# Patient Record
Sex: Female | Born: 1970 | Race: Black or African American | Hispanic: No | Marital: Single | Smoking: Never smoker
Health system: Southern US, Community
[De-identification: ages and names within clinical notes are randomized; demographics above are authoritative.]

## PROBLEM LIST (undated history)

## (undated) DIAGNOSIS — I10 Essential (primary) hypertension: Secondary | ICD-10-CM

## (undated) DIAGNOSIS — E079 Disorder of thyroid, unspecified: Secondary | ICD-10-CM

## (undated) HISTORY — DX: Essential (primary) hypertension: I10

## (undated) HISTORY — DX: Disorder of thyroid, unspecified: E07.9

---

## 1998-04-22 ENCOUNTER — Other Ambulatory Visit: Admission: RE | Admit: 1998-04-22 | Discharge: 1998-04-22 | Payer: Self-pay | Admitting: Obstetrics and Gynecology

## 1999-09-05 ENCOUNTER — Other Ambulatory Visit: Admission: RE | Admit: 1999-09-05 | Discharge: 1999-09-05 | Payer: Self-pay | Admitting: *Deleted

## 2000-09-21 ENCOUNTER — Other Ambulatory Visit: Admission: RE | Admit: 2000-09-21 | Discharge: 2000-09-21 | Payer: Self-pay | Admitting: *Deleted

## 2001-11-03 ENCOUNTER — Other Ambulatory Visit: Admission: RE | Admit: 2001-11-03 | Discharge: 2001-11-03 | Payer: Self-pay | Admitting: *Deleted

## 2002-12-05 ENCOUNTER — Other Ambulatory Visit: Admission: RE | Admit: 2002-12-05 | Discharge: 2002-12-05 | Payer: Self-pay | Admitting: *Deleted

## 2003-05-04 ENCOUNTER — Encounter (INDEPENDENT_AMBULATORY_CARE_PROVIDER_SITE_OTHER): Payer: Self-pay | Admitting: Specialist

## 2003-05-04 ENCOUNTER — Ambulatory Visit (HOSPITAL_COMMUNITY): Admission: RE | Admit: 2003-05-04 | Discharge: 2003-05-04 | Payer: Self-pay | Admitting: *Deleted

## 2003-12-04 ENCOUNTER — Other Ambulatory Visit: Admission: RE | Admit: 2003-12-04 | Discharge: 2003-12-04 | Payer: Self-pay | Admitting: *Deleted

## 2005-10-19 ENCOUNTER — Encounter (INDEPENDENT_AMBULATORY_CARE_PROVIDER_SITE_OTHER): Payer: Self-pay | Admitting: Specialist

## 2005-10-19 ENCOUNTER — Ambulatory Visit (HOSPITAL_COMMUNITY): Admission: RE | Admit: 2005-10-19 | Discharge: 2005-10-19 | Payer: Self-pay | Admitting: *Deleted

## 2005-10-19 ENCOUNTER — Ambulatory Visit (HOSPITAL_BASED_OUTPATIENT_CLINIC_OR_DEPARTMENT_OTHER): Admission: RE | Admit: 2005-10-19 | Discharge: 2005-10-19 | Payer: Self-pay | Admitting: *Deleted

## 2008-02-24 ENCOUNTER — Ambulatory Visit (HOSPITAL_COMMUNITY): Admission: RE | Admit: 2008-02-24 | Discharge: 2008-02-25 | Payer: Self-pay | Admitting: *Deleted

## 2008-02-24 ENCOUNTER — Encounter (INDEPENDENT_AMBULATORY_CARE_PROVIDER_SITE_OTHER): Payer: Self-pay | Admitting: *Deleted

## 2008-03-05 ENCOUNTER — Observation Stay (HOSPITAL_COMMUNITY): Admission: AD | Admit: 2008-03-05 | Discharge: 2008-03-06 | Payer: Self-pay | Admitting: Obstetrics & Gynecology

## 2009-08-20 ENCOUNTER — Emergency Department (HOSPITAL_COMMUNITY): Admission: EM | Admit: 2009-08-20 | Discharge: 2009-08-20 | Payer: Self-pay | Admitting: Family Medicine

## 2010-11-12 ENCOUNTER — Emergency Department (HOSPITAL_COMMUNITY): Admission: EM | Admit: 2010-11-12 | Discharge: 2010-11-12 | Payer: Self-pay | Admitting: Family Medicine

## 2011-03-03 LAB — POCT URINALYSIS DIPSTICK
Bilirubin Urine: NEGATIVE
Glucose, UA: NEGATIVE mg/dL
Hgb urine dipstick: NEGATIVE
Ketones, ur: NEGATIVE mg/dL
pH: 5.5 (ref 5.0–8.0)

## 2011-03-28 LAB — POCT URINALYSIS DIP (DEVICE)
Glucose, UA: NEGATIVE mg/dL
Nitrite: NEGATIVE
Specific Gravity, Urine: >=1.03 (ref 1.005–1.030)
Urobilinogen, UA: 1 mg/dL (ref 0.0–1.0)

## 2011-03-28 LAB — POCT PREGNANCY, URINE: Preg Test, Ur: NEGATIVE

## 2011-05-05 NOTE — Op Note (Signed)
NAME:  Rachel Jensen, Rachel Jensen            ACCOUNT NO.:  [id number]   MEDICAL RECORD NO.:  [id number]          PATIENT TYPE:  OIB   LOCATION:  0098                         FACILITY:  [facility]   PHYSICIAN:  Gerri Spore B. Earlene Plater, M.D.  DATE OF BIRTH:  06-Jul-1971   DATE OF PROCEDURE:  02/24/2008  DATE OF DISCHARGE:                               OPERATIVE REPORT   PREOPERATIVE DIAGNOSES:  Abnormal uterine bleeding and uterine fibroids.   POSTOPERATIVE DIAGNOSES:  Abnormal uterine bleeding and uterine  fibroids.   PROCEDURE:  Da Vinci total laparoscopic hysterectomy with uterine  morcellation.   SURGEON:  Dr. Marina Gravel.   ASSISTANT:  Dr. Genia Del.   ANESTHESIA:  General.   FINDINGS:  A 976 gram fibroid uterus, normal-appearing tubes and ovaries  otherwise normal-appearing pelvis and upper abdomen.   BLOOD LOSS:  150 mL.   COMPLICATIONS:  None.   SPECIMENS:  Uterus and cervix to pathology.   INDICATIONS:  Patient with a history of heavy menstrual bleeding,  associated bulk related symptoms from fibroid uterus, requesting  definitive surgical management.  Patient advised of the risks of surgery  including infection, bleeding, damage to surrounding organs and  potential need to convert to abdominal hysterectomy given the bulk of  her uterus.   PROCEDURE:  The patient was taken to the operating room and general  anesthesia obtained.  She was prepped and draped in standard fashion and  a Foley catheter inserted into the bladder.  The uterus was sounded to  10 cm.  The #10 Rumi tip was assembled, inserted and secured in standard  fashion.   A 10-mm incision placed in the umbilicus, carried sharply to the fascia.  The fascia was divided sharply and elevated with Kocher clamps.  The  posterior sheath and peritoneum were elevated and entered sharply.  A  pursestring suture of #0 Vicryl placed around the fascial defect.  Hassan cannula inserted and secured.  Pneumoperitoneum obtained  with CO2  gas.  Trendelenburg position obtained. The pelvis inspected.  Ancillary  ports placed, two to the left of midline, two to the right, each under  direct laparoscopic visualization.  The three robotic ports were 8 mm  and the assistant port was 10 mm.   The robot was brought in and docked in a standard manner.  The uterus  was elevated with the Rumi and the course of each ureter identified,  found to be well away.  The uterus was approximately 7-8 cm above the  pelvic brim but appeared quite mobile and with good visualization of the  anterior and posterior cul-de-sacs as well as the course of each ureter.  Therefore it seemed reasonable to proceed.   The left round ligament was sealed and divided with gyrus PK and  monopolar scissors.  The right tube and uterine ovarian pedicle were  similarly sealed and divided.  The bladder flap was created sharply, the  left uterine artery skeletonized, sealed and divided with a gyrus PK and  monopolar scissors at the level of the KOHring.  The entire procedure  was repeated on the right side in the exact same  manner.  We had  switched to the 30 degree down scope during this portion of the  dissection to facilitate visualization around the bulky uterus.   Anterior colpotomy made with monopolar scissors around to the angles,  posterior colpotomy made in a similar manner.  The angles were taken  down with gyrus PK.  The uterus was taken off of the Rumi and placed in  the gutter.  The Rumi was removed along with the KOH-ring. The pneumo-  occluder balloon was inserted into the vagina to maintain  pneumoperitoneum. The vaginal cuff was then closed with a running stitch  of zero  PDS. It was taken from the right angle to the midline and then  from the left angle to midline and tied in the middle.  Hemostasis was  obtained. The pelvis was irrigated, pneumoperitoneum taken down, the  vaginal cuff was hemostatic.  Therefore the robot was undocked  and taken  away. The morcellator was inserted through the assistant port and the  uterus morcellated in standard fashion.  All debris was removed.  The  pelvis was copiously irrigated and thoroughly inspected along with the  upper abdomen for any debris of the uterus which was all removed.   The ancillary ports were removed.  Their sites were inspected  laparoscopically and were found to be hemostatic. The scope was removed,  gas released, Hassan cannula removed.  The umbilical incision elevated  with [employer] retractors and the pursestring suture snugged down.  This  did not completely obliterate the fascial defect, therefore there was  about a 1 cm segment above the pursestring suture which was elevated  with Kocher clamps and carefully closed with interrupted stitches of the  same suture taking care to avoid the underlying structures.  The  subcutaneous tissue at each site was reapproximated with 4-0 Vicryl. The  skin was closed with Dermabond.   The patient tolerated the procedure well with no complications.  She was  taken to the recovery room awake, alert in stable condition.  All counts  were correct per the operating  room staff.      Gerri Spore B. Earlene Plater, M.D.  Electronically Signed     WBD/MEDQ  D:  02/24/2008  T:  02/27/2008  Job:  [id number]

## 2011-05-05 NOTE — Op Note (Signed)
NAME:  Rachel Jensen, Rachel Jensen            ACCOUNT NO.:  [id number]   MEDICAL RECORD NO.:  [id number]          PATIENT TYPE:  INP   LOCATION:  9318                          FACILITY:  [facility]   PHYSICIAN:  Genia Del, M.D.DATE OF BIRTH:  May 16, 1971   DATE OF PROCEDURE:  03/05/2008  DATE OF DISCHARGE:                               OPERATIVE REPORT   PREOPERATIVE DIAGNOSIS:  Vaginal bleeding postop total laparoscopy  hysterectomy by robotic with partial vaginal vault dehiscence.   POSTOPERATIVE DIAGNOSIS:  Vaginal bleeding postop total laparoscopy  hysterectomy by robotic with partial vaginal vault dehiscence.   PROCEDURE:  Vaginal vault repair.   SURGEON:  Dr. Genia Del, no assistant.   ANESTHESIOLOGIST:  Dr. Arby Barrette.   PROCEDURE:  Under MAC analgesia, the patient is in lithotomy position.  She is prepped with Betadine on the suprapubic, vulvar and vaginal  areas.  The bladder is catheterized.  A weighted speculum is used and an  anterior and lateral retractor.  We visualize the vaginal vault which is  partially dehiscent.  The vaginal mucosa is not completely closed but no  communication is visible from the vagina to the intraperitoneal cavity.  We have minimal old blood but no active bleeding.  A figure-of-eight is  done at the vaginal vault towards the left side with Vicryl 0 and then a  second figure-of-eight more towards the right aspect of the vaginal  vault with Vicryl 0 again.  That closes the vaginal mucosa well.  Hemostasis is adequate.  We therefore removed all instruments.  The  estimated blood loss was minimal.  No complications occurred and the  patient was brought to recovery room in good stable status.  Note that  Flagyl 500 mg IV was given at induction and the patient will be  continued on antibiotics.  Her preop white blood cell count was above 15  but the patient was afebrile and her hemoglobin went from 9 to 8.3 in  maternity admission but the patient was  quite dehydrated and IV  rehydration with Ringer's lactate was done and the second hemoglobin was  probably more diluted.      Genia Del, M.D.  Electronically Signed     ML/MEDQ  D:  03/05/2008  T:  03/06/2008  Job:  [id number]

## 2011-05-08 NOTE — Op Note (Signed)
NAME:  [name], [name]            ACCOUNT NO.:  [id number]   MEDICAL RECORD NO.:  [id number]          PATIENT TYPE:  AMB   LOCATION:  NESC                         FACILITY:  [facility]   PHYSICIAN:  Pershing Cox, M.D.DATE OF BIRTH:  19-May-1971   DATE OF PROCEDURE:  10/19/2005  DATE OF DISCHARGE:                                 OPERATIVE REPORT   Rachel Jensen is a 40 year old African-American female who has a known  problem of multiple uterine myomas. She actually had a uterine myoma  resected with a large endometrial polyp in May 2004. She presented for  annual examination in June and was complaining of a six months of increasing  heavy bleeding with clots. Sonogram was ordered and confirmed multiple  uterine myomas, and in the endometrial cavity one was noted. She  subsequently underwent a hydrosonogram which confirmed the presence of two  myomas which appeared to be intraluminal. She received Lupron one dose but  has continued to bleed lightly while on the Lupron. She is brought to the  operating room today for resection of these myomas.   OPERATIVE FINDINGS:  The endometrial canal sounded to a depth of 11 cm. I am  sure this is erroneous because one of the fibroids broadly covered the  fundus. The tubal ostia were never visualized, probably compressed and  distorted by this large fundal myoma. Both of the myomas were broadly  attached to the myometrium, and no plane could be seen to get around the  myomas. They were resected to their bases on each side.   PROCEDURE:  Rachel Jensen was brought to the operating with an IV in  place. She received a gram of Ancef in the holding area. Supine on the OR  table, IV sedation was administered followed by the placement of an LMA. She  was placed into Allen stirrups and positioned for surgery. Betadine was used  to prep her perineum and vagina. The patient was then draped for sterile  vaginal procedure. A collecting drape was placed  beneath her hips to  carefully measure the effluent during our procedure. Sorbitol was used for  the distension of her endometrial canal.   A bivalve speculum was inserted into the vagina. Cervix was anesthetized  with Marcaine paracervical block using a total of 10 cc injected in the  anterior cervix at 3, 4, 7, and 8 positions. A sound was passed into the  endometrial cavity easily. Serial Pratt dilators were used to dilate to size  31. The resectoscope attached to a double loop wire was placed through the  cervix. Using through-and-through sorbitol irrigation, the cavity was  visualized. The upper cavity could not be seen at all because of the fibroid  arising from the left lower sidewall. Therefore, I began the resection of  this fibroid first. The settings were cut 190, 110, blend 1. During the  procedure, this loop actually was damaged and could not be used any longer,  and I used a single loop, and the settings for that were 120/110 pure, no  blend. It should be noted that the single loop wire was much more  efficient  in resecting these myomas.   With complete visualization throughout the resection, the fibroids were  resected from the sides and topped to the base. Once the lower left lower  quadrant fibroid had been resected, I was then able to see into the upper  endometrium. The fibroid there was projecting into the cavity but did not  appear to be at all broadly attached on sonogram but was broadly attached on  the examination. Using the single loop wire, the fibroid was serially  resected  until we were at its base. Fortunately, I was able to complete this before  we reached a deficit of 965 cc. The uterus was carefully photographed, and  the procedure was stopped. The patient was taken to the recovery room in  excellent condition. She will receive another dose of Lupron in my office  and will be placed on iron for the interval.      Pershing Cox, M.D.   Electronically Signed     MAJ/MEDQ  D:  10/19/2005  T:  10/19/2005  Job:  [id number]

## 2011-05-08 NOTE — Op Note (Signed)
NAME:  Rachel Jensen, Rachel Jensen                      ACCOUNT NO.:  [id number]   MEDICAL RECORD NO.:  [id number]                   PATIENT TYPE:  AMB   LOCATION:  DAY                                  FACILITY:  [facility]   PHYSICIAN:  Pershing Cox, M.D.            DATE OF BIRTH:  1971-04-12   DATE OF PROCEDURE:  05/04/2003  DATE OF DISCHARGE:                                 OPERATIVE REPORT   PREOPERATIVE DIAGNOSES:  1. Dysfunction uterine bleeding on oral contraceptives.  2. Intraluminal uterine myoma.  3. Multiple uterine myomas.   POSTOPERATIVE DIAGNOSES:  1. Multiple uterine myomas.  2. Large endometrial polyp.  3. Two small submucosal myomas.   PROCEDURE:  1. Exam under anesthesia.  2. Fractional D&C.  3. Resection of large fundal uterine polyp and two small submucosal myomas.   ANESTHESIA:  General endotracheal and Marcaine paracervical block.   SURGEON:  Pershing Cox, M.D.   INDICATIONS FOR PROCEDURE:  this patient is 40 years old.  She has had one  pregnancy in the past.  She has been on oral contraceptives and has had  heavy and irregular bleeding.  Sonogram documented her myomas then suggested  a submucosal myoma.  Hydrosonogram was performed which showed a large  intraluminal filling defect, consistent with either a polyp or a myoma.  The  patient is brought to the operating room today for a resection of this  intraluminal defect.   FINDINGS:  The patient's uterus is 12 weeks in size with an irregular shape.  It is anteflexed.  The endometrial cavity was 11 cm in depth.  On  hysteroscopy, we found a very large polyp arising from the uterine fundus.  There were two small submucosal myomas in the left lower uterine segment.   DESCRIPTION OF PROCEDURE:  Rachel Jensen was brought to the operating  room with an IV in place.  In the holding area, she received 1 g of Ancef.  She was taken to the operating room and supine on the OR table, general  endotracheal  anesthesia was administered without difficulty.  She was then  placed into Allen stirrups, and exam under anesthesia was performed.  Hibiclens was used to prep the vagina, perineum, and upper thighs.  A red  rubber catheter was used to sterilely empty the bladder.  The patient was  then draped for a vaginal procedure with a collecting drape beneath her hips  so that we could measure the effluent carefully during the procedure.   Bivalve speculum was inserted into the vagina.  The cervix was visualized,  and a single-tooth tenaculum was used to grasp the anterior cervix.  Marcaine 0.25% was injected into the periphery of the cervix at the 3,4, 7,  and 8 positions, using a total volume of 12 mL of 0.25% Marcaine.  Kevorkian  curette was used to obtain endocervical curettings.  The sound then passed  to a depth of 11 cm.  Serial Pratt dilators were used to dilate the cervix  to size 33.  The resectoscope was introduced through the cervix and using  through-and-through sorbitol irrigation, the cavity was visualized, and  photographs were taken.  I was not able to see the tubal ostia, but I was  able to see the entire cavity well.   The tissue at the top of the uterus was softer than I would anticipate for a  fibroid.  This was resected using the resectoscope with a right-angle wire  set on 110/110 blend 1.  Fragments of the polyp were resected and removed  using the Randall stone forceps.  The base of the polyp was grasped with the  Randall stone forceps and retrieved by force.  Once the large polyp was out,  I was able to inspect the cavity, and there was no active bleeding from the  site of resection.  A small sharp curette was then used to serially curette  the lining of the endometrium.  The cavity was reinspected, and there were  two small uterine myomas, one arising from the left wall of the uterus and  one arising from the lower uterine segment on the left.  Again, using the   resectoscope with the right-angle wire at settings blend 1, blend 1, these  fibroids were carefully resected until the wall was smooth.  It is almost  certain that portions of the fibroids were left behind because these were  deeply imbedded in the wall.  There was no significant bleeding at the end  of the procedure.  The instruments were removed.  Pressure was placed on the  cervix because there was bleeding from the tenaculum sites.  Prior to  removing the tenaculum, the uterine sound passed again to a depth of 11 cm.   SPECIMENS:  1. Endocervical curettings.  2. Endometrial polyp and endometrial curettings.  3. Fragments of uterine myomas.   COMPLICATIONS:  None.                                               Pershing Cox, M.D.    MAJ/MEDQ  D:  05/04/2003  T:  05/04/2003  Job:  [id number]

## 2011-09-14 LAB — CBC
Hemoglobin: 11.8 — ABNORMAL LOW
Hemoglobin: 6.6 — CL
Hemoglobin: 8.3 — ABNORMAL LOW
Hemoglobin: 9.3 — ABNORMAL LOW
MCHC: 33.9
MCHC: 32.4
MCHC: 33.7
MCV: 83.1
Platelets: 534 — ABNORMAL HIGH
Platelets: 559 — ABNORMAL HIGH
RBC: 2.35 — ABNORMAL LOW
RBC: 3.01 — ABNORMAL LOW
RBC: 3.34 — ABNORMAL LOW
RDW: 24.7 — ABNORMAL HIGH
RDW: 25.6 — ABNORMAL HIGH
RDW: 22.4 — ABNORMAL HIGH
WBC: 9.7

## 2011-09-14 LAB — DIFFERENTIAL
Basophils Absolute: 0
Basophils Relative: 1
Lymphocytes Relative: 28
Neutro Abs: 2.8
Neutrophils Relative %: 61

## 2011-09-14 LAB — TYPE AND SCREEN
ABO/RH(D): B NEG
ABO/RH(D): B NEG
Antibody Screen: NEGATIVE
Antibody Screen: NEGATIVE

## 2011-09-14 LAB — ABO/RH: ABO/RH(D): B NEG

## 2011-09-14 LAB — PREGNANCY, URINE: Preg Test, Ur: NEGATIVE

## 2013-07-04 ENCOUNTER — Other Ambulatory Visit: Payer: Self-pay | Admitting: Internal Medicine

## 2013-07-04 DIAGNOSIS — Z1231 Encounter for screening mammogram for malignant neoplasm of breast: Secondary | ICD-10-CM

## 2013-07-27 ENCOUNTER — Ambulatory Visit
Admission: RE | Admit: 2013-07-27 | Discharge: 2013-07-27 | Disposition: A | Discharge status: Home / Self Care | Payer: 59 | Source: Ambulatory Visit | Attending: Internal Medicine | Admitting: Internal Medicine

## 2013-07-27 DIAGNOSIS — Z1231 Encounter for screening mammogram for malignant neoplasm of breast: Secondary | ICD-10-CM

## 2014-08-22 ENCOUNTER — Other Ambulatory Visit: Payer: Self-pay

## 2014-08-22 DIAGNOSIS — Z1231 Encounter for screening mammogram for malignant neoplasm of breast: Secondary | ICD-10-CM

## 2014-09-04 ENCOUNTER — Ambulatory Visit
Admission: RE | Admit: 2014-09-04 | Discharge: 2014-09-04 | Disposition: A | Discharge status: Home / Self Care | Payer: 59 | Source: Ambulatory Visit

## 2014-09-04 DIAGNOSIS — Z1231 Encounter for screening mammogram for malignant neoplasm of breast: Secondary | ICD-10-CM

## 2015-08-08 ENCOUNTER — Other Ambulatory Visit: Payer: Self-pay

## 2015-08-08 DIAGNOSIS — Z1231 Encounter for screening mammogram for malignant neoplasm of breast: Secondary | ICD-10-CM

## 2015-08-22 ENCOUNTER — Other Ambulatory Visit: Payer: Self-pay | Admitting: Orthopedic Surgery

## 2015-08-22 DIAGNOSIS — R52 Pain, unspecified: Secondary | ICD-10-CM

## 2015-08-22 DIAGNOSIS — M2560 Stiffness of unspecified joint, not elsewhere classified: Secondary | ICD-10-CM

## 2015-09-06 ENCOUNTER — Ambulatory Visit
Admission: RE | Admit: 2015-09-06 | Discharge: 2015-09-06 | Disposition: A | Discharge status: Home / Self Care | Payer: 59 | Source: Ambulatory Visit

## 2015-09-06 DIAGNOSIS — Z1231 Encounter for screening mammogram for malignant neoplasm of breast: Secondary | ICD-10-CM

## 2016-09-21 ENCOUNTER — Other Ambulatory Visit: Payer: Self-pay | Admitting: Family Medicine

## 2016-09-21 DIAGNOSIS — Z1231 Encounter for screening mammogram for malignant neoplasm of breast: Secondary | ICD-10-CM

## 2016-10-09 ENCOUNTER — Ambulatory Visit: Payer: 59

## 2016-10-15 ENCOUNTER — Ambulatory Visit
Admission: RE | Admit: 2016-10-15 | Discharge: 2016-10-15 | Disposition: A | Discharge status: Home / Self Care | Payer: 59 | Source: Ambulatory Visit | Attending: Family Medicine | Admitting: Family Medicine

## 2016-10-15 DIAGNOSIS — Z1231 Encounter for screening mammogram for malignant neoplasm of breast: Secondary | ICD-10-CM

## 2017-08-05 ENCOUNTER — Ambulatory Visit (INDEPENDENT_AMBULATORY_CARE_PROVIDER_SITE_OTHER): Payer: 59

## 2017-08-05 ENCOUNTER — Ambulatory Visit (INDEPENDENT_AMBULATORY_CARE_PROVIDER_SITE_OTHER): Payer: 59 | Admitting: Orthopedic Surgery

## 2017-08-05 ENCOUNTER — Encounter (INDEPENDENT_AMBULATORY_CARE_PROVIDER_SITE_OTHER): Payer: Self-pay | Admitting: Orthopedic Surgery

## 2017-08-05 DIAGNOSIS — M7541 Impingement syndrome of right shoulder: Secondary | ICD-10-CM | POA: Diagnosis not present

## 2017-08-05 DIAGNOSIS — M25511 Pain in right shoulder: Secondary | ICD-10-CM

## 2017-08-05 MED ORDER — LIDOCAINE HCL 1 % IJ SOLN
5.0000 mL | INTRAMUSCULAR | Status: AC | PRN
  Administered 2017-08-05: 5 mL

## 2017-08-05 MED ORDER — METHYLPREDNISOLONE ACETATE 40 MG/ML IJ SUSP
40.0000 mg | INTRAMUSCULAR | Status: AC | PRN
  Administered 2017-08-05: 40 mg via INTRA_ARTICULAR

## 2017-08-05 NOTE — Progress Notes (Signed)
Office Visit Note   Patient: Rachel Jensen           Date of Birth: 02/28/1971           MRN: [id number] Visit Date: 08/05/2017              Requested by: No referring provider defined for this encounter. PCP: No primary care provider on file.  Chief Complaint  Patient presents with  . Right Shoulder - Pain      HPI: Patient is a 46 year old woman who complains of anterior right shoulder pain which she describes as throbbing pain radiating up to her neck and down into the biceps. She has pain with and without activity she states she often sleeps on the right side and this tends to make her pain worse. Past medical history positive for hypertension.  Assessment & Plan: Visit Diagnoses:  1. Acute pain of right shoulder   2. Impingement syndrome of right shoulder     Plan: Right shoulder was injected without complications. She will follow up as needed if she is still symptomatic we'll reevaluate her follow-up.  Follow-Up Instructions: Return if symptoms worsen or fail to improve.   Ortho Exam  Patient is alert, oriented, no adenopathy, well-dressed, normal affect, normal respiratory effort. Examination patient has full range of motion of the right shoulder. She has pain with Neer and Hawkins impingement test the before meals joint is nontender to palpation of biceps tendon is tender to palpation.  Imaging: Xr Shoulder Right  Result Date: 08/05/2017 Three-view radiographs of the right shoulder shows a congruent glenohumeral joint the lung field is clear there is superior migration of the humeral head within the glenoid there is some mild arthritic changes of the acromioclavicular joint.  No images are attached to the encounter.  Labs: No results found for: HGBA1C, ESRSEDRATE, CRP, LABURIC, REPTSTATUS, GRAMSTAIN, CULT, LABORGA  Orders:  Orders Placed This Encounter  Procedures  . XR Shoulder Right   No orders of the defined types were placed in this encounter.    Procedures: Large Joint Inj Date/Time: 08/05/2017 3:38 PM Performed by: Leif Loflin V Authorized by: Nadara MustardUDA, Tricia [place] V   Consent Given by:  Patient Site marked: the procedure site was marked   Timeout: prior to procedure the correct patient, procedure, and site was verified   Indications:  Pain and diagnostic evaluation Location:  Shoulder Site:  R subacromial bursa Prep: patient was prepped and draped in usual sterile fashion   Needle Size:  22 G Needle Length:  1.5 inches Approach:  Posterior Ultrasound Guidance: No   Fluoroscopic Guidance: No   Arthrogram: No   Medications:  5 mL lidocaine 1 %; 40 mg methylPREDNISolone acetate 40 MG/ML Aspiration Attempted: No   Patient tolerance:  Patient tolerated the procedure well with no immediate complications    Clinical Data: No additional findings.  ROS:  All other systems negative, except as noted in the HPI. Review of Systems  Objective: Vital Signs: LMP  (LMP Unknown)   Specialty Comments:  No specialty comments available.  PMFS History: There are no active problems to display for this patient.  Past Medical History:  Diagnosis Date  . Hypertension   . Thyroid dysfunction     History reviewed. No pertinent family history.  History reviewed. No pertinent surgical history. Social History   Occupational History  . Not on file.   Social History Main Topics  . Smoking status: Never Smoker  . Smokeless tobacco: Never Used  .  Alcohol use Not on file  . Drug use: Unknown  . Sexual activity: Not on file

## 2017-10-25 ENCOUNTER — Other Ambulatory Visit: Payer: Self-pay | Admitting: Family Medicine

## 2017-10-25 DIAGNOSIS — Z1231 Encounter for screening mammogram for malignant neoplasm of breast: Secondary | ICD-10-CM

## 2017-11-17 ENCOUNTER — Other Ambulatory Visit: Payer: Self-pay | Admitting: Family Medicine

## 2017-11-17 DIAGNOSIS — R748 Abnormal levels of other serum enzymes: Secondary | ICD-10-CM

## 2017-11-22 ENCOUNTER — Ambulatory Visit
Admission: RE | Admit: 2017-11-22 | Discharge: 2017-11-22 | Disposition: A | Discharge status: Home / Self Care | Payer: 59 | Source: Ambulatory Visit | Attending: Family Medicine | Admitting: Family Medicine

## 2017-11-22 DIAGNOSIS — R748 Abnormal levels of other serum enzymes: Secondary | ICD-10-CM

## 2017-11-23 ENCOUNTER — Ambulatory Visit
Admission: RE | Admit: 2017-11-23 | Discharge: 2017-11-23 | Disposition: A | Discharge status: Home / Self Care | Payer: 59 | Source: Ambulatory Visit | Attending: Family Medicine | Admitting: Family Medicine

## 2017-11-23 DIAGNOSIS — Z1231 Encounter for screening mammogram for malignant neoplasm of breast: Secondary | ICD-10-CM

## 2018-10-21 ENCOUNTER — Other Ambulatory Visit: Payer: Self-pay | Admitting: Family Medicine

## 2018-10-21 DIAGNOSIS — Z1231 Encounter for screening mammogram for malignant neoplasm of breast: Secondary | ICD-10-CM

## 2018-12-02 ENCOUNTER — Ambulatory Visit
Admission: RE | Admit: 2018-12-02 | Discharge: 2018-12-02 | Disposition: A | Discharge status: Home / Self Care | Payer: 59 | Source: Ambulatory Visit | Attending: Family Medicine | Admitting: Family Medicine

## 2018-12-02 DIAGNOSIS — Z1231 Encounter for screening mammogram for malignant neoplasm of breast: Secondary | ICD-10-CM

## 2019-03-10 ENCOUNTER — Encounter: Payer: Self-pay | Admitting: Allergy

## 2019-03-10 ENCOUNTER — Ambulatory Visit (INDEPENDENT_AMBULATORY_CARE_PROVIDER_SITE_OTHER): Payer: Managed Care, Other (non HMO) | Admitting: Allergy

## 2019-03-10 ENCOUNTER — Other Ambulatory Visit: Payer: Self-pay

## 2019-03-10 VITALS — BP 126/80 | HR 99 | Resp 16 | Ht 67.0 in | Wt 192.0 lb

## 2019-03-10 DIAGNOSIS — J3089 Other allergic rhinitis: Secondary | ICD-10-CM

## 2019-03-10 DIAGNOSIS — H1013 Acute atopic conjunctivitis, bilateral: Secondary | ICD-10-CM

## 2019-03-10 MED ORDER — CARBINOXAMINE MALEATE 6 MG PO TABS: 1.0000 | ORAL_TABLET | Freq: Two times a day (BID) | ORAL | 5 refills | Status: DC

## 2019-03-10 MED ORDER — OLOPATADINE HCL 0.7 % OP SOLN: 1.0000 [drp] | Freq: Every day | OPHTHALMIC | 5 refills | Status: DC | PRN

## 2019-03-10 MED ORDER — TRIAMCINOLONE ACETONIDE 55 MCG/ACT NA AERO: 2.0000 | INHALATION_SPRAY | Freq: Two times a day (BID) | NASAL | 5 refills | Status: DC

## 2019-03-10 NOTE — Progress Notes (Signed)
New Patient Note  RE: Rachel Jensen MRN: [id number] DOB: May 07, 1971 Date of Office Visit: 03/10/2019  Referring provider: Maurice Small, MD Primary care provider: Maurice Small, MD  Chief Complaint: allergies  History of present illness: Rachel Jensen is a 48 y.o. female presenting today for consultation for allergies.   She states she is always congested.  Dhe also reports nasal drainage with PND, sneezing, watery eyes.   She has tried flonase 2 sprays once day for about 3-4 months without any significant improvement.  She also was prescribed azelastine that she used 2 sprays each nostril daily for about 2 months again without any significant improvement.  She has taken zyrtec daily which is somewhat help. She has also tried claritin and xyzal as well as in the past with incomplete relief.   She believes she has an over-the-counter eyedrop in the past.   She denies having sinus infections that require antibiotics.  She has not seen an ear nose and throat doctor at this time has never had a sinus CT scan.   No history of eczema, asthma or food allergy.   Review of systems: Review of Systems  Constitutional: Negative for chills, fever and malaise/fatigue.  HENT: Positive for congestion. Negative for ear discharge, nosebleeds and sore throat.   Eyes: Negative for pain, discharge and redness.  Respiratory: Negative for cough, shortness of breath and wheezing.   Cardiovascular: Negative for chest pain.  Gastrointestinal: Negative for abdominal pain, constipation, diarrhea, heartburn, nausea and vomiting.  Musculoskeletal: Negative for joint pain.  Skin: Negative for itching and rash.  Neurological: Negative for headaches.    All other systems negative unless noted above in HPI  Past medical history: Past Medical History:  Diagnosis Date  . Hypertension   . Thyroid dysfunction     Past surgical history: History reviewed. No pertinent surgical history.  Family  history:  Family History  Problem Relation Age of Onset  . Breast cancer Paternal Uncle   . Allergic rhinitis Father   . Urticaria Neg Hx   . Immunodeficiency Neg Hx   . Eczema Neg Hx   . Atopy Neg Hx   . Asthma Neg Hx   . Angioedema Neg Hx     Social history: She lives in a home with carpeting with gas heating and central cooling.  There are no pets in the home.  There is no concern for water damage, mildew or roaches in the home.  She works inpatient billing.  She denies a smoking history.  Medication List: Allergies as of 03/10/2019      Reactions   Sulfamethoxazole-trimethoprim Swelling      Medication List       Accurate as of March 10, 2019 12:15 PM. Always use your most recent med list.        amLODipine 10 MG tablet Commonly known as:  NORVASC   benazepril-hydrochlorthiazide 10-12.5 MG tablet Commonly known as:  LOTENSIN HCT TK 1 T PO QD   Carbinoxamine Maleate 6 MG Tabs Commonly known as:  RyVent Take 1 tablet by mouth 2 (two) times daily.   cetirizine 10 MG tablet Commonly known as:  ZYRTEC Take 10 mg by mouth daily.   Olopatadine HCl 0.7 % Soln Commonly known as:  Pazeo Place 1 drop into both eyes daily as needed.   traZODone 50 MG tablet Commonly known as:  DESYREL TK 1 T PO ONCE A DAY HS TO HELP WITH SLEEP PRN   triamcinolone 55 MCG/ACT  Aero nasal inhaler Commonly known as:  NASACORT Place 2 sprays into the nose 2 (two) times daily.       Known medication allergies: Allergies  Allergen Reactions  . Sulfamethoxazole-Trimethoprim Swelling     Physical examination: Blood pressure 126/80, pulse 99, resp. rate 16, height 5\' 7"  (1.702 m), weight 192 lb (87.1 kg), SpO2 98 %.  General: Alert, interactive, in no acute distress. HEENT: PERRLA, TMs pearly gray, turbinates markedly edematous and pale without discharge with near complete obstruction bilaterally, post-pharynx non erythematous. Neck: Supple without lymphadenopathy. Lungs: Clear to  auscultation without wheezing, rhonchi or rales. {no increased work of breathing. CV: Normal S1, S2 without murmurs. Abdomen: Nondistended, nontender. Skin: Warm and dry, without lesions or rashes. Extremities:  No clubbing, cyanosis or edema. Neuro:   Grossly intact.  Diagnositics/Labs: Allergy testing: Environmental allergy skin prick testing is positive to dust mites. Intradermal testing is positive to cockroach. Allergy testing results were read and interpreted by provider, documented by clinical staff.   Assessment and plan:   Allergic rhinitis with conjunctivitis -Environmental allergy skin prick testing is positive to dust mites and cockroach -Allergen avoidance measures discussed/handouts provided today -You have near complete obstruction of your nasal passages on both sides -To help decongest the nose recommend using Afrin 2 sprays each nostril twice a day for the next 3 to 5 days (do not take more than 5 days in a row).  Wait several minutes after Afrin use or until you notice decrease congestion and can breathe more freely than usual medicated nasal spray below. -Nasacort 2 sprays each nostril twice a day at this time.  Once nasal congestion is under better control decrease down to daily use. -Try RyVent 6mg  twice a day -Pazeo 1 drop each eye daily as needed for itchy/watery/red eyes -allergen immunotherapy discussed today including protocol, benefits and risk.  Informational handout provided.  If interested in this therapuetic option you can check with your insurance carrier for coverage.  Let us know if you would like to proceed with this option.    Follow-up in 3 to 4 months or sooner if needed  I appreciate the opportunity to take part in Almena's care. Please do not hesitate to contact me with questions.  Sincerely,   Margo Aye, MD Allergy/Immunology [facility]

## 2019-03-10 NOTE — Patient Instructions (Addendum)
Allergic rhinitis with conjunctivitis -Environmental allergy skin prick testing is positive to dust mites and cockroach -Allergen avoidance measures discussed/handouts provided today -You have near complete obstruction of your nasal passages on both sides -To help decongest the nose recommend using Afrin 2 sprays each nostril twice a day for the next 3 to 5 days (do not take more than 5 days in a row).  Wait several minutes after Afrin use or until you notice decrease congestion and can breathe more freely than usual medicated nasal spray below. -Nasacort 2 sprays each nostril twice a day at this time.  Once nasal congestion is under better control decrease down to daily use. -Try RyVent 6mg  twice a day -Pazeo 1 drop each eye daily as needed for itchy/watery/red eyes -allergen immunotherapy discussed today including protocol, benefits and risk.  Informational handout provided.  If interested in this therapuetic option you can check with your insurance carrier for coverage.  Let us know if you would like to proceed with this option.    Follow-up in 3 to 4 months or sooner if needed

## 2019-11-06 ENCOUNTER — Other Ambulatory Visit: Payer: Self-pay | Admitting: Family Medicine

## 2019-11-06 DIAGNOSIS — Z1231 Encounter for screening mammogram for malignant neoplasm of breast: Secondary | ICD-10-CM

## 2020-01-01 ENCOUNTER — Ambulatory Visit
Admission: RE | Admit: 2020-01-01 | Discharge: 2020-01-01 | Disposition: A | Discharge status: Home / Self Care | Payer: Managed Care, Other (non HMO) | Source: Ambulatory Visit | Attending: Family Medicine | Admitting: Family Medicine

## 2020-01-01 ENCOUNTER — Other Ambulatory Visit: Payer: Self-pay

## 2020-01-01 DIAGNOSIS — Z1231 Encounter for screening mammogram for malignant neoplasm of breast: Secondary | ICD-10-CM

## 2020-02-20 ENCOUNTER — Encounter: Payer: Self-pay | Admitting: Sports Medicine

## 2020-02-20 ENCOUNTER — Other Ambulatory Visit: Payer: Self-pay

## 2020-02-20 ENCOUNTER — Ambulatory Visit: Payer: Managed Care, Other (non HMO) | Admitting: Sports Medicine

## 2020-02-20 ENCOUNTER — Other Ambulatory Visit: Payer: Self-pay | Admitting: Sports Medicine

## 2020-02-20 ENCOUNTER — Ambulatory Visit (INDEPENDENT_AMBULATORY_CARE_PROVIDER_SITE_OTHER): Payer: Managed Care, Other (non HMO)

## 2020-02-20 VITALS — BP 169/103 | HR 93 | Temp 96.9°F

## 2020-02-20 DIAGNOSIS — M216X1 Other acquired deformities of right foot: Secondary | ICD-10-CM

## 2020-02-20 DIAGNOSIS — M79671 Pain in right foot: Secondary | ICD-10-CM

## 2020-02-20 DIAGNOSIS — M79673 Pain in unspecified foot: Secondary | ICD-10-CM

## 2020-02-20 DIAGNOSIS — M216X2 Other acquired deformities of left foot: Secondary | ICD-10-CM

## 2020-02-20 DIAGNOSIS — M722 Plantar fascial fibromatosis: Secondary | ICD-10-CM

## 2020-02-20 DIAGNOSIS — M779 Enthesopathy, unspecified: Secondary | ICD-10-CM

## 2020-02-20 DIAGNOSIS — M79672 Pain in left foot: Secondary | ICD-10-CM

## 2020-02-20 NOTE — Progress Notes (Signed)
Subjective: Rachel Jensen is a 49 y.o. female patient presents to office with complaint of moderate arch pain on the left and right. Patient admits that pain is worse on the left arch greater than the right pain increases during running and jumping activities and subsidence a few minutes after this activity has stopped reports that she has tried changing shoes but pain still continues especially when she is working out after about 30 to 40 minutes in both arches.  Patient reports that pain has been going on for 3 years but has started to flare back up since she has worked out with [employer].  Patient denies any significant radiating symptoms of redness warmth swelling or drainage to the affected areas.  Review of Systems  All other systems reviewed and are negative.   There are no problems to display for this patient.   Current Outpatient Medications on File Prior to Visit  Medication Sig Dispense Refill  . amLODipine (NORVASC) 10 MG tablet     . benazepril-hydrochlorthiazide (LOTENSIN HCT) 10-12.5 MG tablet TK 1 T PO QD    . cetirizine (ZYRTEC) 10 MG tablet Take 10 mg by mouth daily.     No current facility-administered medications on file prior to visit.    Allergies  Allergen Reactions  . Sulfamethoxazole-Trimethoprim Swelling    Objective: Physical Exam General: The patient is alert and oriented x3 in no acute distress.  Dermatology: Skin is warm, dry and supple bilateral lower extremities. Nails 1-10 are normal. There is no erythema, edema, no eccymosis, no open lesions present. Integument is otherwise unremarkable.  Vascular: Dorsalis Pedis pulse and Posterior Tibial pulse are 2/4 bilateral. Capillary fill time is immediate to all digits.  Neurological: Grossly intact to light touch with an achilles reflex of +2/5 and a  negative Tinel's sign bilateral.  Musculoskeletal: Tenderness to palpation at the medial arches bilateral there is no pain on plantar fascial insertion at  the heel there is significant tightness along the Achilles tendon. No pain with calf compression bilateral. There is decreased Ankle joint range of motion bilateral. All other joints range of motion within normal limits bilateral.  Pes planus foot type.  Strength 5/5 in all groups bilateral.   Gait: Unassisted  Xray, Left foot:  Normal osseous mineralization. Joint spaces preserved. No fracture/dislocation/boney destruction.  Posterior and inferior calcaneal spur present with mild thickening of plantar fascia. No other soft tissue abnormalities or radiopaque foreign bodies.   Assessment and Plan: Problem List Items Addressed This Visit    None    Visit Diagnoses    Bilateral foot pain    -  Primary   Relevant Orders   DG Foot Complete Left (Completed)   Arch pain, unspecified laterality       Plantar fasciitis       exercise induced   Tendinitis       Acquired equinus deformity of both feet          -Complete examination performed.  -Xrays reviewed -Discussed with patient in detail the condition of plantar fasciitis likely exercise-induced with equinus and tendinitis, how this occurs and general treatment options. Explained both conservative and surgical treatments.  -Patient declined oral medication at this time -No injection administered at the plantar fascia because her pain is mid arch bilateral only exercise-induced -Recommended good supportive shoes and advised use of OTC insert.  - Explained in detail the use of the night splint x1 which was dispensed at today's visit. -Explained and dispensed to patient  daily stretching exercises. -Recommend patient to ice affected area 1-2x daily. -Patient to return as needed or call office if not better in 2 weeks or sooner if problems or questions arise.  If pain is still there in 2 weeks we will proceed with sending oral anti-inflammatory/steroid to her pharmacy.  Landis Martins, DPM

## 2020-12-19 ENCOUNTER — Other Ambulatory Visit: Payer: Self-pay | Admitting: Family Medicine

## 2020-12-19 DIAGNOSIS — Z1231 Encounter for screening mammogram for malignant neoplasm of breast: Secondary | ICD-10-CM

## 2021-01-28 ENCOUNTER — Ambulatory Visit: Payer: Managed Care, Other (non HMO)

## 2021-01-28 ENCOUNTER — Ambulatory Visit
Admission: RE | Admit: 2021-01-28 | Discharge: 2021-01-28 | Disposition: A | Discharge status: Home / Self Care | Payer: Managed Care, Other (non HMO) | Source: Ambulatory Visit | Attending: Family Medicine | Admitting: Family Medicine

## 2021-01-28 ENCOUNTER — Other Ambulatory Visit: Payer: Self-pay

## 2021-01-28 DIAGNOSIS — Z1231 Encounter for screening mammogram for malignant neoplasm of breast: Secondary | ICD-10-CM

## 2021-01-31 ENCOUNTER — Other Ambulatory Visit: Payer: Self-pay | Admitting: Family Medicine

## 2021-01-31 DIAGNOSIS — R928 Other abnormal and inconclusive findings on diagnostic imaging of breast: Secondary | ICD-10-CM

## 2021-02-13 ENCOUNTER — Ambulatory Visit: Payer: Managed Care, Other (non HMO)

## 2021-02-13 ENCOUNTER — Other Ambulatory Visit: Payer: Self-pay

## 2021-02-13 ENCOUNTER — Ambulatory Visit
Admission: RE | Admit: 2021-02-13 | Discharge: 2021-02-13 | Disposition: A | Discharge status: Home / Self Care | Payer: Managed Care, Other (non HMO) | Source: Ambulatory Visit | Attending: Family Medicine | Admitting: Family Medicine

## 2021-02-13 DIAGNOSIS — R928 Other abnormal and inconclusive findings on diagnostic imaging of breast: Secondary | ICD-10-CM

## 2022-01-14 ENCOUNTER — Other Ambulatory Visit: Payer: Self-pay | Admitting: Family Medicine

## 2022-01-14 DIAGNOSIS — Z1231 Encounter for screening mammogram for malignant neoplasm of breast: Secondary | ICD-10-CM

## 2022-01-30 ENCOUNTER — Ambulatory Visit
Admission: RE | Admit: 2022-01-30 | Discharge: 2022-01-30 | Disposition: A | Discharge status: Home / Self Care | Payer: Managed Care, Other (non HMO) | Source: Ambulatory Visit | Attending: Family Medicine | Admitting: Family Medicine

## 2022-01-30 DIAGNOSIS — Z1231 Encounter for screening mammogram for malignant neoplasm of breast: Secondary | ICD-10-CM

## 2023-03-03 ENCOUNTER — Other Ambulatory Visit: Payer: Self-pay | Admitting: Internal Medicine

## 2023-03-03 DIAGNOSIS — Z1231 Encounter for screening mammogram for malignant neoplasm of breast: Secondary | ICD-10-CM

## 2023-04-15 ENCOUNTER — Ambulatory Visit
Admission: RE | Admit: 2023-04-15 | Discharge: 2023-04-15 | Disposition: A | Discharge status: Home / Self Care | Payer: Managed Care, Other (non HMO) | Source: Ambulatory Visit | Attending: Internal Medicine | Admitting: Internal Medicine

## 2023-04-15 DIAGNOSIS — Z1231 Encounter for screening mammogram for malignant neoplasm of breast: Secondary | ICD-10-CM

## 2023-06-15 IMAGING — MG MM DIGITAL SCREENING BILAT W/ TOMO AND CAD
6 of 10 series · 6 of 30 positions shown · non-contrast
Comparison: Previous exam(s).

CLINICAL DATA: Screening.

EXAM:
DIGITAL SCREENING BILATERAL MAMMOGRAM WITH TOMOSYNTHESIS AND CAD
TECHNIQUE: Bilateral screening digital craniocaudal and mediolateral oblique
mammograms were obtained. Bilateral screening digital breast
tomosynthesis was performed. The images were evaluated with
computer-aided detection.

[L MLO synth-2D]
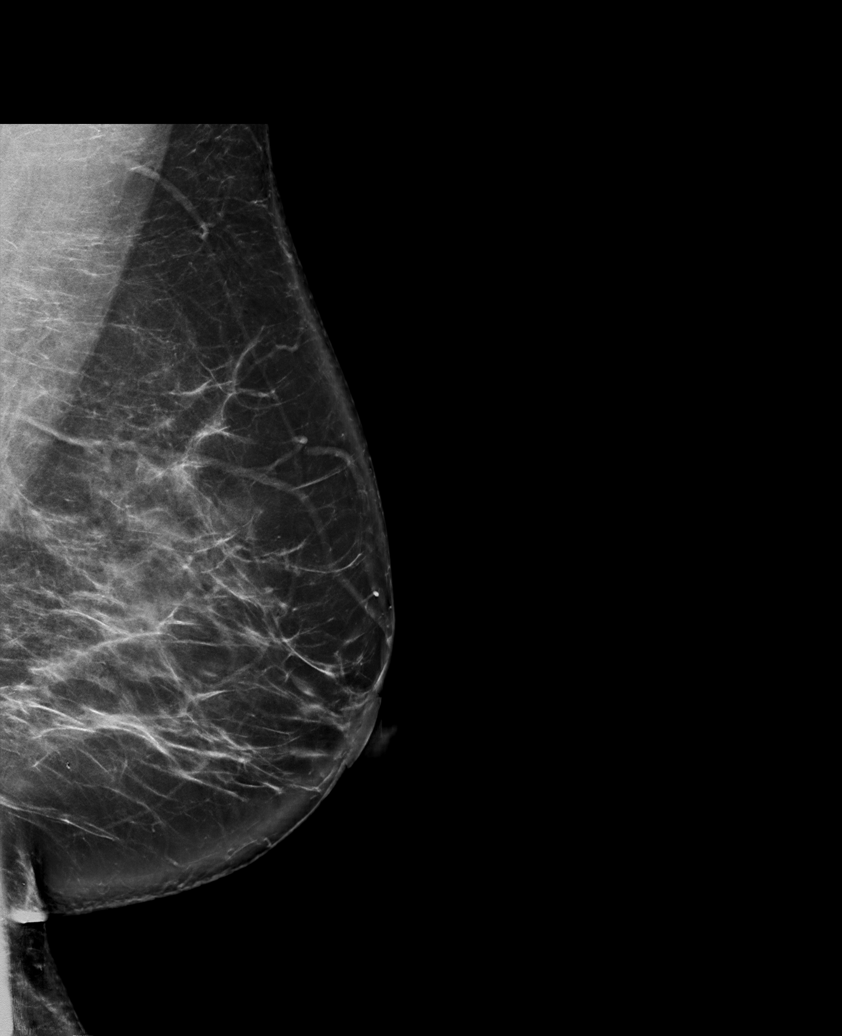

[L CC synth-2D (1 of 2)]
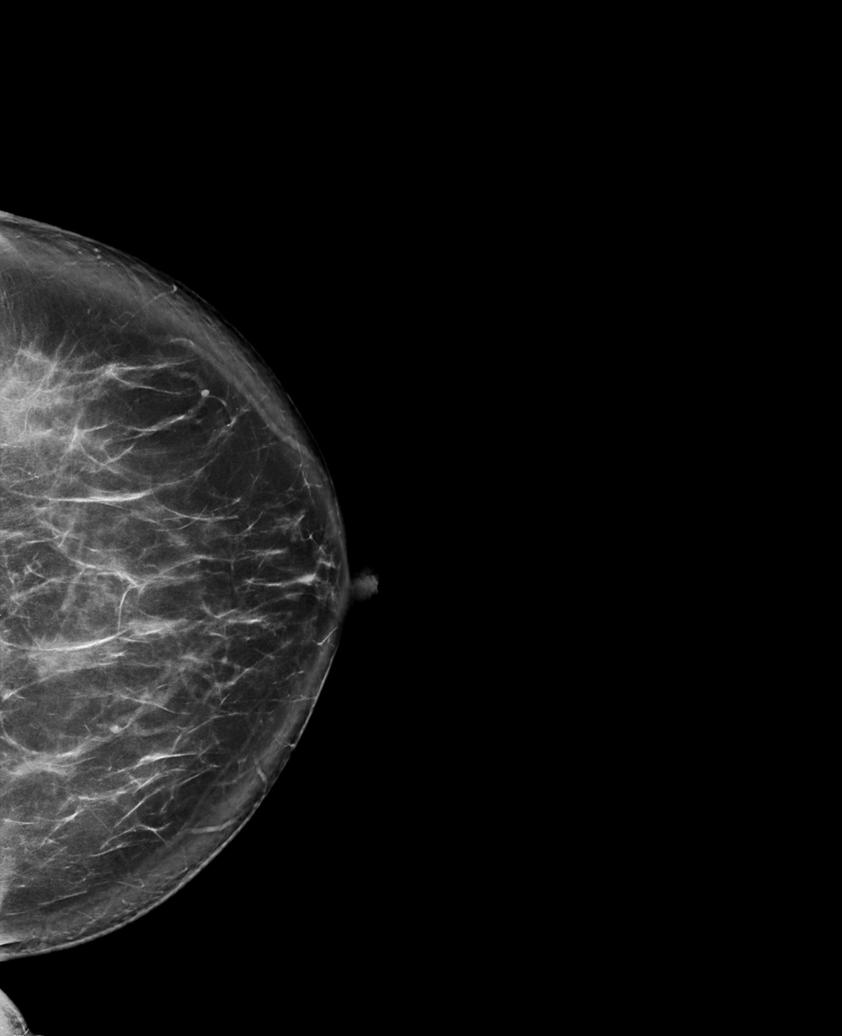

[L CC synth-2D (2 of 2)]
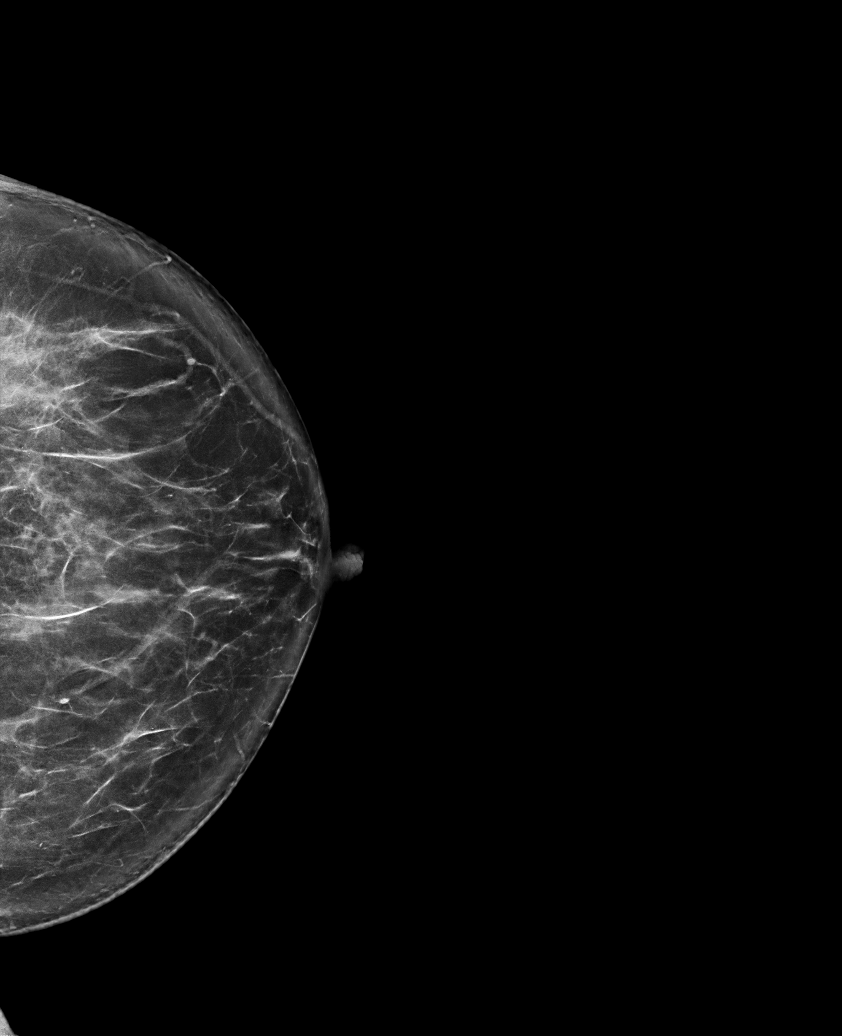

[R CC synth-2D]
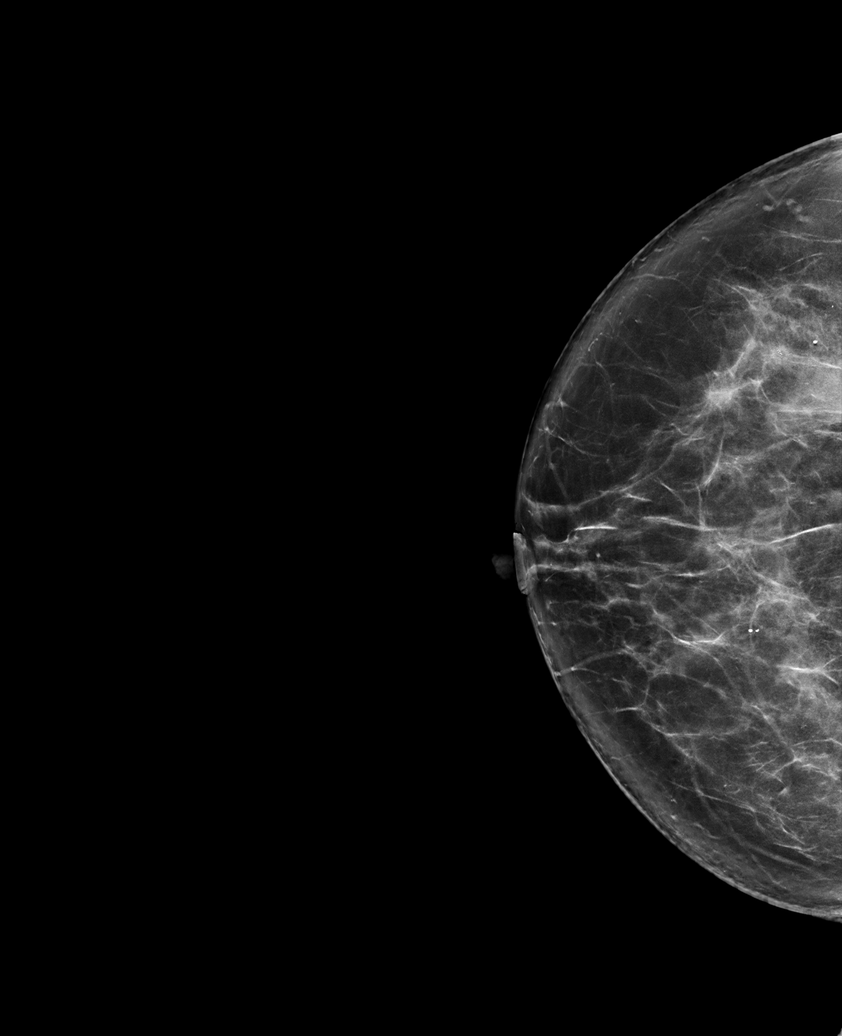

[R MLO synth-2D]
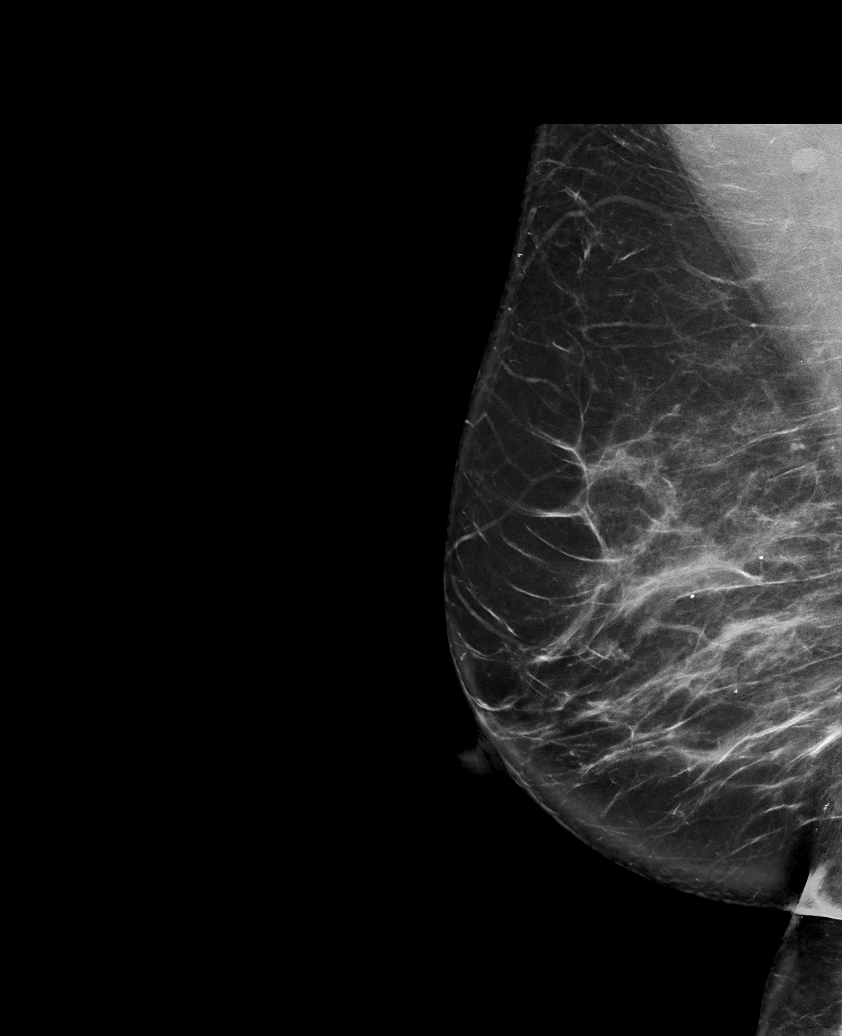

[R MLO tomo · tomo slice 50/99.0]
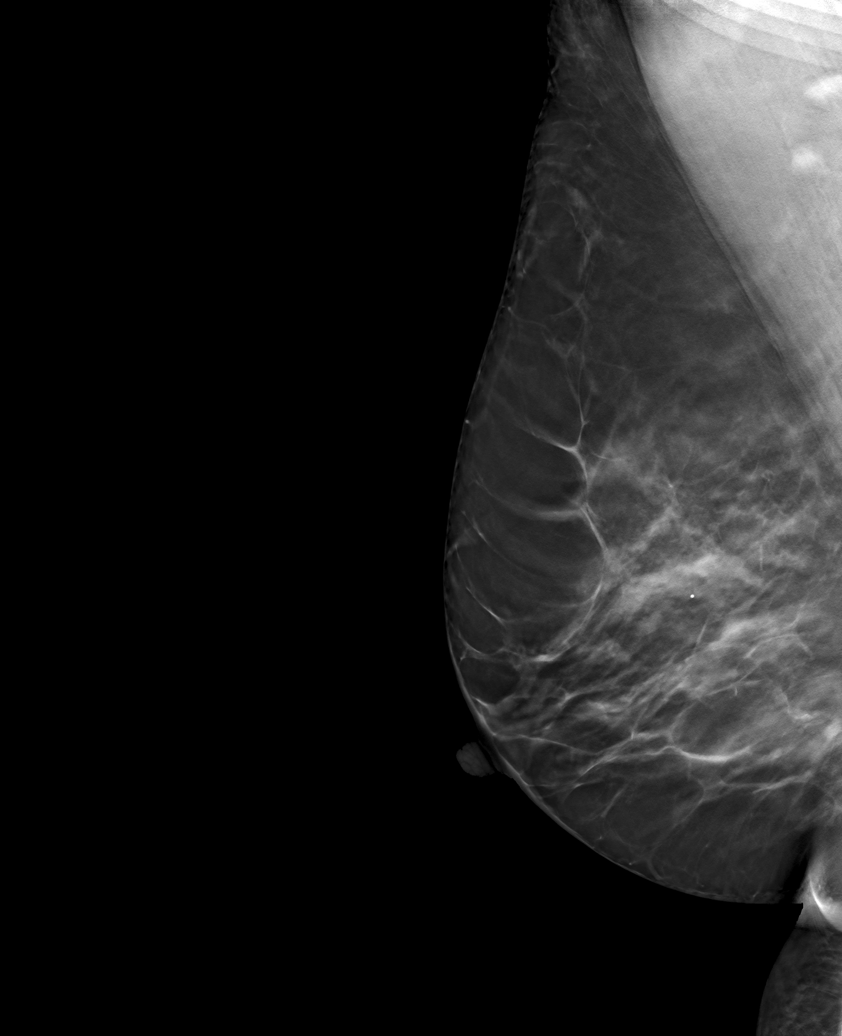

[6 of 30 positions shown; findings below may reference images not displayed]

ACR Breast Density Category c: The breast tissue is heterogeneously
dense, which may obscure small masses.
FINDINGS: There are no findings suspicious for malignancy.
IMPRESSION: No mammographic evidence of malignancy. A result letter of this
screening mammogram will be mailed directly to the patient.

RECOMMENDATION:
Screening mammogram in one year. (Code:Q3-W-BC3)

BI-RADS CATEGORY  1: Negative.

## 2023-12-19 ENCOUNTER — Emergency Department (HOSPITAL_BASED_OUTPATIENT_CLINIC_OR_DEPARTMENT_OTHER): Payer: Managed Care, Other (non HMO)

## 2023-12-19 ENCOUNTER — Other Ambulatory Visit: Payer: Self-pay

## 2023-12-19 ENCOUNTER — Encounter (HOSPITAL_BASED_OUTPATIENT_CLINIC_OR_DEPARTMENT_OTHER): Payer: Self-pay | Admitting: Emergency Medicine

## 2023-12-19 ENCOUNTER — Emergency Department (HOSPITAL_BASED_OUTPATIENT_CLINIC_OR_DEPARTMENT_OTHER)
Admission: EM | Admit: 2023-12-19 | Discharge: 2023-12-19 | Disposition: A | Discharge status: Home / Self Care | Payer: Managed Care, Other (non HMO) | Attending: Emergency Medicine | Admitting: Emergency Medicine

## 2023-12-19 DIAGNOSIS — K659 Peritonitis, unspecified: Secondary | ICD-10-CM | POA: Diagnosis not present

## 2023-12-19 DIAGNOSIS — K6389 Other specified diseases of intestine: Secondary | ICD-10-CM

## 2023-12-19 DIAGNOSIS — R1031 Right lower quadrant pain: Secondary | ICD-10-CM | POA: Diagnosis present

## 2023-12-19 LAB — URINALYSIS, ROUTINE W REFLEX MICROSCOPIC
Bacteria, UA: NONE SEEN
Bilirubin Urine: NEGATIVE
Glucose, UA: >1000 mg/dL — AB
Hgb urine dipstick: NEGATIVE
Ketones, ur: NEGATIVE mg/dL
Leukocytes,Ua: NEGATIVE
Nitrite: NEGATIVE
Protein, ur: NEGATIVE mg/dL
Specific Gravity, Urine: 1.009 (ref 1.005–1.030)
pH: 5 (ref 5.0–8.0)

## 2023-12-19 LAB — COMPREHENSIVE METABOLIC PANEL
ALT: 37 U/L (ref 0–44)
AST: 19 U/L (ref 15–41)
Albumin: 5 g/dL (ref 3.5–5.0)
Alkaline Phosphatase: 89 U/L (ref 38–126)
Anion gap: 8 (ref 5–15)
BUN: 15 mg/dL (ref 6–20)
CO2: 30 mmol/L (ref 22–32)
Calcium: 10.4 mg/dL — ABNORMAL HIGH (ref 8.9–10.3)
Chloride: 101 mmol/L (ref 98–111)
Creatinine, Ser: 0.79 mg/dL (ref 0.44–1.00)
GFR, Estimated: >60 mL/min (ref >60)
Glucose, Bld: 105 mg/dL — ABNORMAL HIGH (ref 70–99)
Potassium: 4 mmol/L (ref 3.5–5.1)
Sodium: 139 mmol/L (ref 135–145)
Total Bilirubin: 0.9 mg/dL (ref <1.2)
Total Protein: 8.6 g/dL — ABNORMAL HIGH (ref 6.5–8.1)

## 2023-12-19 LAB — CBC
HCT: 48.2 % — ABNORMAL HIGH (ref 36.0–46.0)
Hemoglobin: 15.4 g/dL — ABNORMAL HIGH (ref 12.0–15.0)
MCH: 27.8 pg (ref 26.0–34.0)
MCHC: 32 g/dL (ref 30.0–36.0)
MCV: 87.2 fL (ref 80.0–100.0)
Platelets: 361 10*3/uL (ref 150–400)
RBC: 5.53 MIL/uL — ABNORMAL HIGH (ref 3.87–5.11)
RDW: 13.9 % (ref 11.5–15.5)
WBC: 6 10*3/uL (ref 4.0–10.5)
nRBC: 0 % (ref 0.0–0.2)

## 2023-12-19 LAB — LIPASE, BLOOD: Lipase: 20 U/L (ref 11–51)

## 2023-12-19 MED ORDER — IOHEXOL 300 MG/ML  SOLN
80.0000 mL | Freq: Once | INTRAMUSCULAR | Status: AC | PRN
  Administered 2023-12-19: 80 mL via INTRAVENOUS

## 2023-12-19 MED ORDER — IBUPROFEN 800 MG PO TABS: 800.0000 mg | ORAL_TABLET | Freq: Three times a day (TID) | ORAL | 0 refills | Status: AC | PRN

## 2023-12-19 NOTE — ED Provider Notes (Signed)
EMERGENCY DEPARTMENT AT [facility] Provider Note   CSN: [id number] Arrival date & time: 12/19/23  1211     History  Chief Complaint  Patient presents with   Abdominal Pain    Rachel Jensen is a 52 y.o. female.  Patient complains of domino pain for the past 2 days.  Patient reports pain is now more pronounced in the right lower quadrant.  Patient reports she has not had a fever.  Patient complains of nausea no vomiting no diarrhea.  Patient reports that she has had pain in her right and left low back.  The history is provided by the patient.  Abdominal Pain Pain location:  Generalized Pain quality: aching   Pain severity:  Moderate Onset quality:  Gradual Duration:  2 days Timing:  Constant Progression:  Worsening Chronicity:  New      Home Medications Prior to Admission medications   Medication Sig Start Date End Date Taking? Authorizing Provider  ibuprofen (ADVIL) 800 MG tablet Take 1 tablet (800 mg total) by mouth every 8 (eight) hours as needed. 12/19/23  Yes Cheron Schaumann K, PA-C  amLODipine (NORVASC) 10 MG tablet  06/29/17   [provider]  benazepril-hydrochlorthiazide (LOTENSIN HCT) 10-12.5 MG tablet TK 1 T PO QD 11/08/18   [provider]  cetirizine (ZYRTEC) 10 MG tablet Take 10 mg by mouth daily.    [provider]      Allergies    Sulfamethoxazole-trimethoprim    Review of Systems   Review of Systems  Gastrointestinal:  Positive for abdominal pain.  All other systems reviewed and are negative.   Physical Exam Updated Vital Signs BP (!) 142/86 (BP Location: Left Arm)   Pulse 87   Temp 98.6 F (37 C) (Oral)   Resp 16   LMP  (LMP Unknown)   SpO2 98%  Physical Exam Vitals and nursing note reviewed.  Constitutional:      Appearance: She is well-developed.  HENT:     Head: Normocephalic.  Cardiovascular:     Rate and Rhythm: Normal rate and regular rhythm.  Pulmonary:     Effort: Pulmonary  effort is normal.  Abdominal:     General: Abdomen is flat. Bowel sounds are normal. There is no distension.     Palpations: Abdomen is soft.     Tenderness: There is abdominal tenderness.  Musculoskeletal:        General: Normal range of motion.     Cervical back: Normal range of motion.  Skin:    General: Skin is warm.  Neurological:     General: No focal deficit present.     Mental Status: She is alert and oriented to person, place, and time.     ED Results / Procedures / Treatments   Labs (all labs ordered are listed, but only abnormal results are displayed) Labs Reviewed  COMPREHENSIVE METABOLIC PANEL - Abnormal; Notable for the following components:      Result Value   Glucose, Bld 105 (*)    Calcium 10.4 (*)    Total Protein 8.6 (*)    All other components within normal limits  CBC - Abnormal; Notable for the following components:   RBC 5.53 (*)    Hemoglobin 15.4 (*)    HCT 48.2 (*)    All other components within normal limits  URINALYSIS, ROUTINE W REFLEX MICROSCOPIC - Abnormal; Notable for the following components:   Color, Urine COLORLESS (*)    Glucose, UA >1,000 (*)  All other components within normal limits  LIPASE, BLOOD    EKG None  Radiology CT ABDOMEN PELVIS W CONTRAST Result Date: 12/19/2023 CLINICAL DATA:  Acute left flank pain. EXAM: CT ABDOMEN AND PELVIS WITH CONTRAST TECHNIQUE: Multidetector CT imaging of the abdomen and pelvis was performed using the standard protocol following bolus administration of intravenous contrast. RADIATION DOSE REDUCTION: This exam was performed according to the departmental dose-optimization program which includes automated exposure control, adjustment of the mA and/or kV according to patient size and/or use of iterative reconstruction technique. CONTRAST:  80mL OMNIPAQUE IOHEXOL 300 MG/ML  SOLN COMPARISON:  March 05, 2008. FINDINGS: Lower chest: No acute abnormality. Hepatobiliary: Right hepatic cyst is noted. No  cholelithiasis or biliary dilatation is noted. Pancreas: Unremarkable. No pancreatic ductal dilatation or surrounding inflammatory changes. Spleen: Normal in size without focal abnormality. Adrenals/Urinary Tract: Adrenal glands are unremarkable. Kidneys are normal, without renal calculi, focal lesion, or hydronephrosis. Bladder is unremarkable. Stomach/Bowel: Stomach and appendix are unremarkable. There is no evidence of bowel obstruction. Stool is noted throughout the colon. Findings consistent with epiploic appendagitis involving descending colon. Vascular/Lymphatic: No significant vascular findings are present. No enlarged abdominal or pelvic lymph nodes. Reproductive: Status post hysterectomy. No adnexal masses. Other: No abdominal wall hernia or abnormality. No abdominopelvic ascites. Musculoskeletal: No acute or significant osseous findings. IMPRESSION: Findings consistent with epiploic appendagitis involving descending colon. Electronically Signed   By: Lupita Raider M.D.   On: 12/19/2023 16:58    Procedures Procedures    Medications Ordered in ED Medications  iohexol (OMNIPAQUE) 300 MG/ML solution 80 mL (80 mLs Intravenous Contrast Given 12/19/23 1520)    ED Course/ Medical Decision Making/ A&P                                 Medical Decision Making Patient complains of lower abdominal pain.  Amount and/or Complexity of Data Reviewed Labs: ordered. Decision-making details documented in ED Course.    Details: Labs ordered reviewed and interpreted.  Patient has a normal white blood cell count. Radiology: ordered and independent interpretation performed. Decision-making details documented in ED Course.    Details: CT abdomen and pelvis shows epiglottic appendagitis.  Risk Prescription drug management. Risk Details: Patient is counseled on results.  She is advised to follow-up with her primary care physician for recheck in 2 to 3 days she should return to the emergency department  for any problems.  Patient is given a prescription for ibuprofen           Final Clinical Impression(s) / ED Diagnoses Final diagnoses:  Epiploic appendagitis    Rx / DC Orders ED Discharge Orders          Ordered    ibuprofen (ADVIL) 800 MG tablet  Every 8 hours PRN        12/19/23 1729           An After Visit Summary was printed and given to the patient.    Elson Areas, PA-C 12/19/23 2210    Durwin Glaze, MD 12/19/23 [phone]

## 2023-12-19 NOTE — ED Notes (Signed)
I have just spoken to Rachel Jensen at [facility] and she advises if no edema and or redness continue to monitor and notify them of any worsening/changes at site.

## 2023-12-19 NOTE — ED Triage Notes (Signed)
Abdominal pain Lower abdominal and left flank Started Friday. Denies n/v/d Reports constant dull ache Moving makes it worse

## 2024-04-12 ENCOUNTER — Other Ambulatory Visit: Payer: Self-pay | Admitting: Internal Medicine

## 2024-04-12 DIAGNOSIS — Z1231 Encounter for screening mammogram for malignant neoplasm of breast: Secondary | ICD-10-CM

## 2024-04-18 ENCOUNTER — Ambulatory Visit
Admission: RE | Admit: 2024-04-18 | Discharge: 2024-04-18 | Disposition: A | Discharge status: Home / Self Care | Source: Ambulatory Visit | Attending: Internal Medicine

## 2024-04-18 DIAGNOSIS — Z1231 Encounter for screening mammogram for malignant neoplasm of breast: Secondary | ICD-10-CM
